# Patient Record
Sex: Male | Born: 1950 | Race: White | Hispanic: No | Marital: Single | Smoking: Never smoker
Health system: Southern US, Community
[De-identification: ages and names within clinical notes are randomized; demographics above are authoritative.]

## PROBLEM LIST (undated history)

## (undated) DIAGNOSIS — M1990 Unspecified osteoarthritis, unspecified site: Secondary | ICD-10-CM

## (undated) HISTORY — PX: TOE SURGERY: SHX1073

## (undated) HISTORY — PX: NECK SURGERY: SHX720

---

## 2011-09-06 ENCOUNTER — Emergency Department (HOSPITAL_COMMUNITY)
Admission: EM | Admit: 2011-09-06 | Discharge: 2011-09-06 | Disposition: A | Discharge status: Home / Self Care | Payer: Self-pay | Attending: Emergency Medicine | Admitting: Emergency Medicine

## 2011-09-06 ENCOUNTER — Emergency Department (HOSPITAL_COMMUNITY): Payer: Self-pay

## 2011-09-06 ENCOUNTER — Encounter (HOSPITAL_COMMUNITY): Payer: Self-pay

## 2011-09-06 DIAGNOSIS — M25569 Pain in unspecified knee: Secondary | ICD-10-CM | POA: Insufficient documentation

## 2011-09-06 DIAGNOSIS — M25469 Effusion, unspecified knee: Secondary | ICD-10-CM | POA: Insufficient documentation

## 2011-09-06 DIAGNOSIS — IMO0002 Reserved for concepts with insufficient information to code with codable children: Secondary | ICD-10-CM | POA: Insufficient documentation

## 2011-09-06 DIAGNOSIS — M1711 Unilateral primary osteoarthritis, right knee: Secondary | ICD-10-CM

## 2011-09-06 DIAGNOSIS — M1710 Unilateral primary osteoarthritis, unspecified knee: Secondary | ICD-10-CM | POA: Insufficient documentation

## 2011-09-06 MED ORDER — OXYCODONE-ACETAMINOPHEN 5-325 MG PO TABS: 1.0000 | ORAL_TABLET | ORAL | Status: AC | PRN

## 2011-09-06 MED ORDER — OXYCODONE-ACETAMINOPHEN 5-325 MG PO TABS
1.0000 | ORAL_TABLET | Freq: Once | ORAL | Status: AC
  Administered 2011-09-06: 1 via ORAL
  Filled 2011-09-06: qty 1

## 2011-09-06 MED ORDER — IBUPROFEN 600 MG PO TABS: 600.0000 mg | ORAL_TABLET | Freq: Four times a day (QID) | ORAL | Status: AC | PRN

## 2011-09-06 NOTE — ED Notes (Signed)
Right knee pain for 1 month, thinks he may have twisted the joint
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
## 2011-09-06 NOTE — ED Notes (Signed)
Pt presents with swelling, redness, and pain to right knee x 1 month. Pt denies injury.

## 2011-09-06 NOTE — ED Notes (Signed)
Pt a/ox4. Resp even and unlabored. NAD at this time. D/C instructions reviewed with pt. Pt verbalized understanding. Pt ambulated to lobby with steady gate.  

## 2011-09-07 NOTE — ED Provider Notes (Signed)
Medical screening examination/treatment/procedure(s) were performed by non-physician practitioner and as supervising physician I was immediately available for consultation/collaboration.   Jelene Albano L Arkie Tagliaferro, MD 09/07/11 0929 

## 2019-10-03 ENCOUNTER — Other Ambulatory Visit: Payer: Self-pay

## 2019-10-03 ENCOUNTER — Emergency Department (HOSPITAL_COMMUNITY): Payer: 59

## 2019-10-03 ENCOUNTER — Emergency Department (HOSPITAL_COMMUNITY)
Admission: EM | Admit: 2019-10-03 | Discharge: 2019-10-03 | Disposition: A | Discharge status: Home / Self Care | Payer: 59 | Attending: Emergency Medicine | Admitting: Emergency Medicine

## 2019-10-03 ENCOUNTER — Encounter (HOSPITAL_COMMUNITY): Payer: Self-pay | Admitting: Emergency Medicine

## 2019-10-03 DIAGNOSIS — M5136 Other intervertebral disc degeneration, lumbar region: Secondary | ICD-10-CM | POA: Diagnosis not present

## 2019-10-03 DIAGNOSIS — Z79899 Other long term (current) drug therapy: Secondary | ICD-10-CM | POA: Diagnosis not present

## 2019-10-03 DIAGNOSIS — M79605 Pain in left leg: Secondary | ICD-10-CM | POA: Insufficient documentation

## 2019-10-03 HISTORY — DX: Unspecified osteoarthritis, unspecified site: M19.90

## 2019-10-03 MED ORDER — PREDNISONE 10 MG (21) PO TBPK: ORAL_TABLET | ORAL | 0 refills | Status: AC

## 2019-10-03 NOTE — ED Provider Notes (Signed)
[facility] EMERGENCY DEPARTMENT Provider Note   CSN: [id number] Arrival date & time: 10/03/19  7564     History Chief Complaint  Patient presents with  . Leg Pain    Lawrence Kaiser is a 69 y.o. male.  HPI   Patient presents to the ED for evaluation of intermittent thigh pain for the last couple of months.  Patient states it has been coming and going but seems becoming more frequent.  Pain is sharp in his left thigh.  It seems to be extending more distally now than when it did initially.  It also wraps around to the outside of his thigh now.  Patient notices that doing certain activities in his yard will bring on the pain.  However he also notices at times just sitting at the table and he will need to stand up because it is painful sitting in the same position.  He denies any fevers or chills.  No leg swelling.  Patient went to an urgent care.  He had x-rays of his leg that were negative reportedly.  He was prescribed meloxicam.  Patient states he continues to have this pain so he came to the ED today.  Past Medical History:  Diagnosis Date  . Arthritis     There are no problems to display for this patient.   Past Surgical History:  Procedure Laterality Date  . NECK SURGERY         History reviewed. No pertinent family history.  Social History   Tobacco Use  . Smoking status: Never Smoker  Substance Use Topics  . Alcohol use: No  . Drug use: No    Home Medications Prior to Admission medications   Medication Sig Start Date End Date Taking? Authorizing Provider  Multiple Vitamin (MULTIVITAMIN WITH MINERALS) TABS tablet Take 1 tablet by mouth daily.   Yes [provider]  clotrimazole-betamethasone (LOTRISONE) cream Apply 1 application topically in the morning and at bedtime. 08/29/19   [provider]  meloxicam (MOBIC) 15 MG tablet Take 15 mg by mouth daily. 08/30/19   [provider]  neomycin-polymyxin b-dexamethasone (MAXITROL) 3.5-10000-0.1  SUSP Place 1 drop into both eyes 4 (four) times daily. 07/26/19   [provider]  Potassium Gluconate 2.5 MEQ TABS Take 1 tablet by mouth daily.    [provider]  predniSONE (STERAPRED UNI-PAK 21 TAB) 10 MG (21) TBPK tablet Take 6 tablets (60 mg total) by mouth daily for 2 days, THEN 5 tablets (50 mg total) daily for 2 days, THEN 4 tablets (40 mg total) daily for 2 days, THEN 3 tablets (30 mg total) daily for 2 days, THEN 2 tablets (20 mg total) daily for 2 days, THEN 1 tablet (10 mg total) daily for 2 days. 10/03/19 10/15/19  Dorie Rank, MD  tobramycin (TOBREX) 0.3 % ophthalmic solution Place 1 drop into both eyes 3 (three) times daily. 05/24/19   [provider]    Allergies    Patient has no known allergies.  Review of Systems   Review of Systems  All other systems reviewed and are negative.   Physical Exam Updated Vital Signs BP (!) 153/70 (BP Location: Right Arm)   Pulse 63   Temp 97.8 F (36.6 C) (Oral)   Resp 16   Ht 1.829 m (6')   Wt 130.2 kg   SpO2 97%   BMI 38.92 kg/m   Physical Exam Vitals and nursing note reviewed.  Constitutional:      General: He is  not in acute distress.    Appearance: He is well-developed.  HENT:     Head: Normocephalic and atraumatic.     Right Ear: External ear normal.     Left Ear: External ear normal.  Eyes:     General: No scleral icterus.       Right eye: No discharge.        Left eye: No discharge.     Conjunctiva/sclera: Conjunctivae normal.  Neck:     Trachea: No tracheal deviation.  Cardiovascular:     Rate and Rhythm: Normal rate.  Pulmonary:     Effort: Pulmonary effort is normal. No respiratory distress.     Breath sounds: No stridor.  Abdominal:     General: There is no distension.     Tenderness: There is no abdominal tenderness.  Musculoskeletal:        General: No swelling or deformity.     Cervical back: Neck supple.     Lumbar back: No tenderness.     Right lower leg: No swelling or  deformity. No edema.     Left lower leg: No swelling or deformity. No edema.     Comments: Patient is able to sit up in bed, no discrete areas of tenderness in the extremity, strong dorsalis pedal pulses bilaterally  Skin:    General: Skin is warm and dry.     Findings: No rash.  Neurological:     Mental Status: He is alert.     Cranial Nerves: Cranial nerve deficit: no gross deficits.     ED Results / Procedures / Treatments   Labs (all labs ordered are listed, but only abnormal results are displayed) Labs Reviewed - No data to display  EKG None  Radiology DG Lumbar Spine Complete  Result Date: 10/03/2019 CLINICAL DATA:  Left-sided radicular type symptoms EXAM: LUMBAR SPINE - COMPLETE 4+ VIEW COMPARISON:  None. FINDINGS: Frontal, lateral, spot lumbosacral lateral, and bilateral oblique views were obtained. There are 5 non-rib-bearing lumbar type vertebral bodies. There is no fracture or spondylolisthesis. There is severe disc space narrowing at L4-5 and L5-S1. There is milder disc space narrowing at L3-4. There are prominent anterior osteophytes at L3, L4, and L5. There is facet osteoarthritic change at L3-4, L4-5, and L5-S1 bilaterally. There are occasional foci of aortic atherosclerosis. IMPRESSION: Osteoarthritic change in the lower lumbar region. No fracture or spondylolisthesis. Aortic Atherosclerosis (ICD10-I70.0). Electronically Signed   By: Bretta Bang III M.D.   On: 10/03/2019 11:01    Procedures Procedures (including critical care time)  Medications Ordered in ED Medications - No data to display  ED Course  I have reviewed the triage vital signs and the nursing notes.  Pertinent labs & imaging results that were available during my care of the patient were reviewed by me and considered in my medical decision making (see chart for details).  Clinical Course as of Oct 02 1129  Mon Oct 03, 2019  1108 Xrays reviewed.  Osteoarthritis noted.    [JK]    Clinical  Course User Index [JK] Linwood Dibbles, MD   MDM Rules/Calculators/A&P                      Physical exam is reassuring.  No signs of infection.  No signs of vascular compromise.  Symptoms are not suggestive of vascular occlusion.  Suspect he could be having some lumbar radiculopathy.  Patient does have some degenerative changes noted on plain films.  He does  not have any acute numbness or weakness.  He has been taking NSAIDs.  I think a short course of steroids is reasonable but I discussed with the patient this may or may not alleviate his symptoms.  Did recommend outpatient follow-up with his primary care doctor and/or a spine surgeon Final Clinical Impression(s) / ED Diagnoses Final diagnoses:  Left leg pain  DDD (degenerative disc disease), lumbar    Rx / DC Orders ED Discharge Orders         Ordered    predniSONE (STERAPRED UNI-PAK 21 TAB) 10 MG (21) TBPK tablet     10/03/19 1130           Linwood Dibbles, MD 10/03/19 1132

## 2019-10-03 NOTE — Discharge Instructions (Signed)
Take the medications as prescribed.  You can continue the meloxicam.  Follow-up with your primary care doctor and consider seeing a spine surgeon as we discussed

## 2019-10-03 NOTE — ED Triage Notes (Signed)
Pt reports having pain in left thigh intermittently for several months. Reports it is worse with certain movements and is excruciating at times.

## 2021-05-29 IMAGING — DX DG LUMBAR SPINE COMPLETE 4+V
5 series · 5 of 5 positions shown · non-contrast
Comparison: None.

CLINICAL DATA: Left-sided radicular type symptoms

EXAM:
LUMBAR SPINE - COMPLETE 4+ VIEW

[l-spine ap]
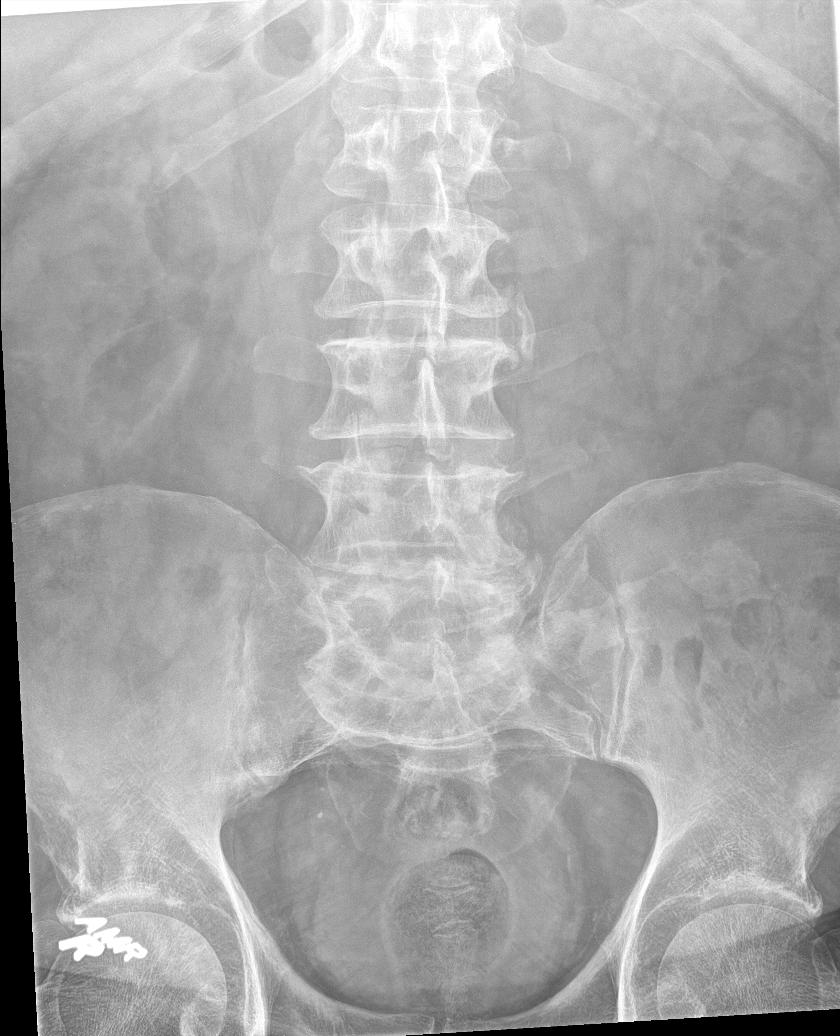

[l-spine obl (1 of 2)]
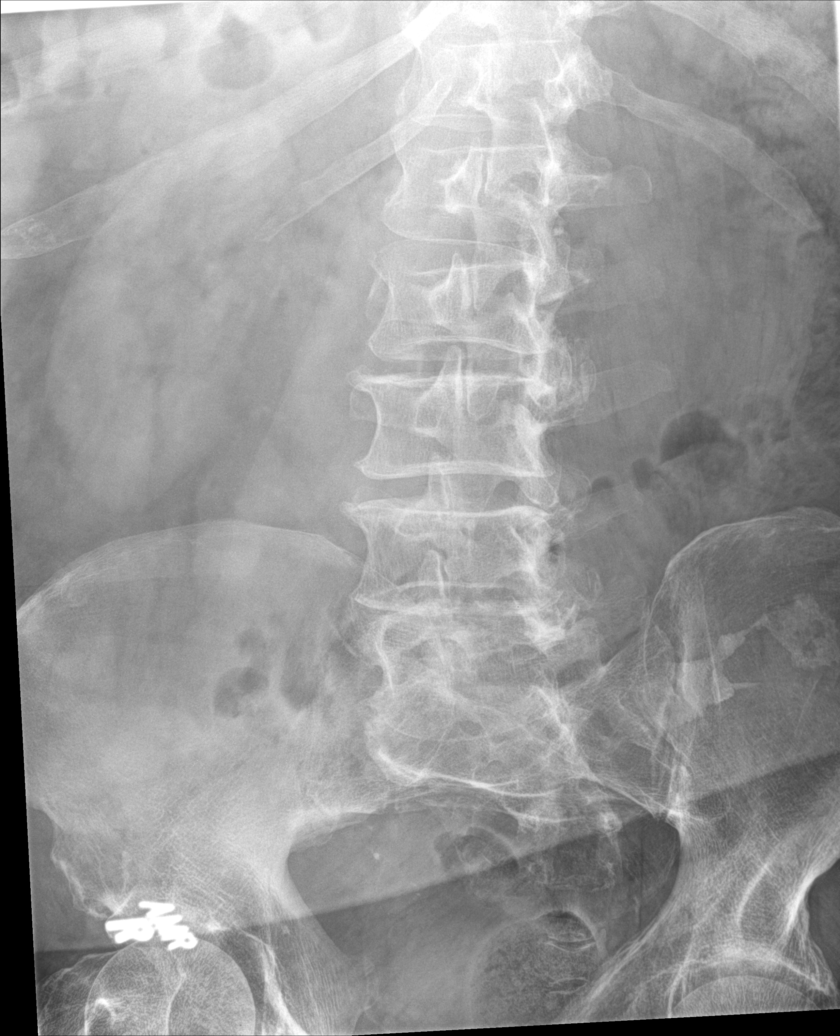

[l-spine obl (2 of 2)]
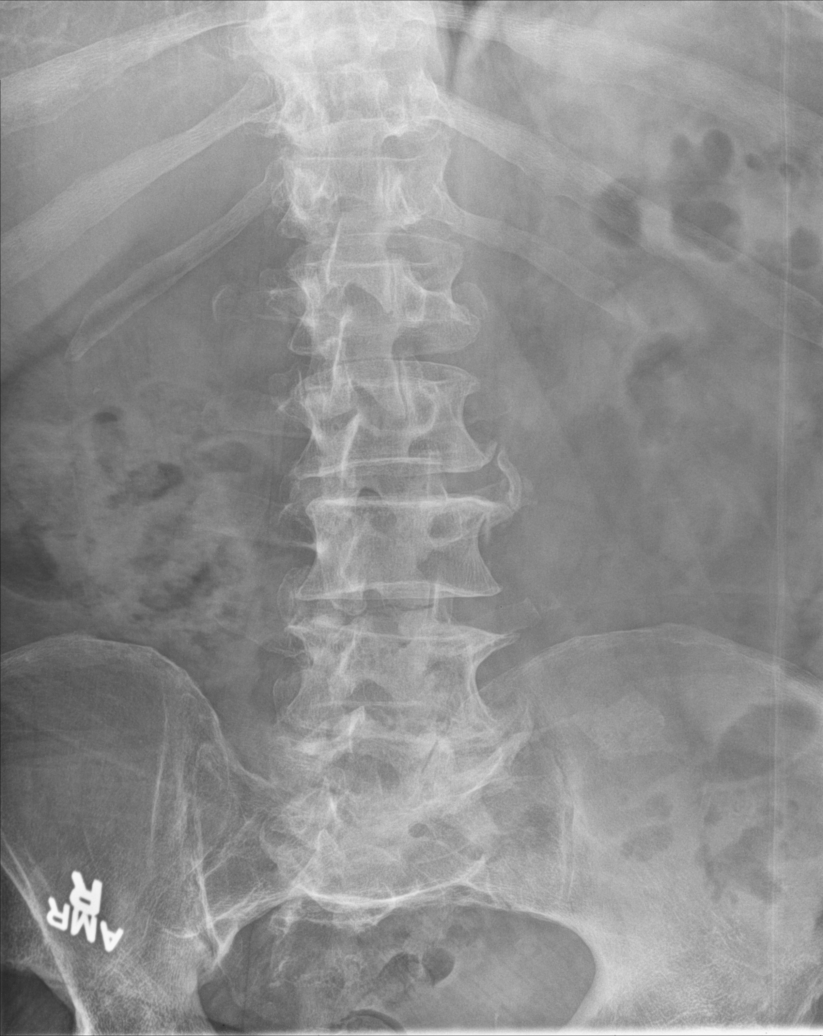

[l-spine lat]
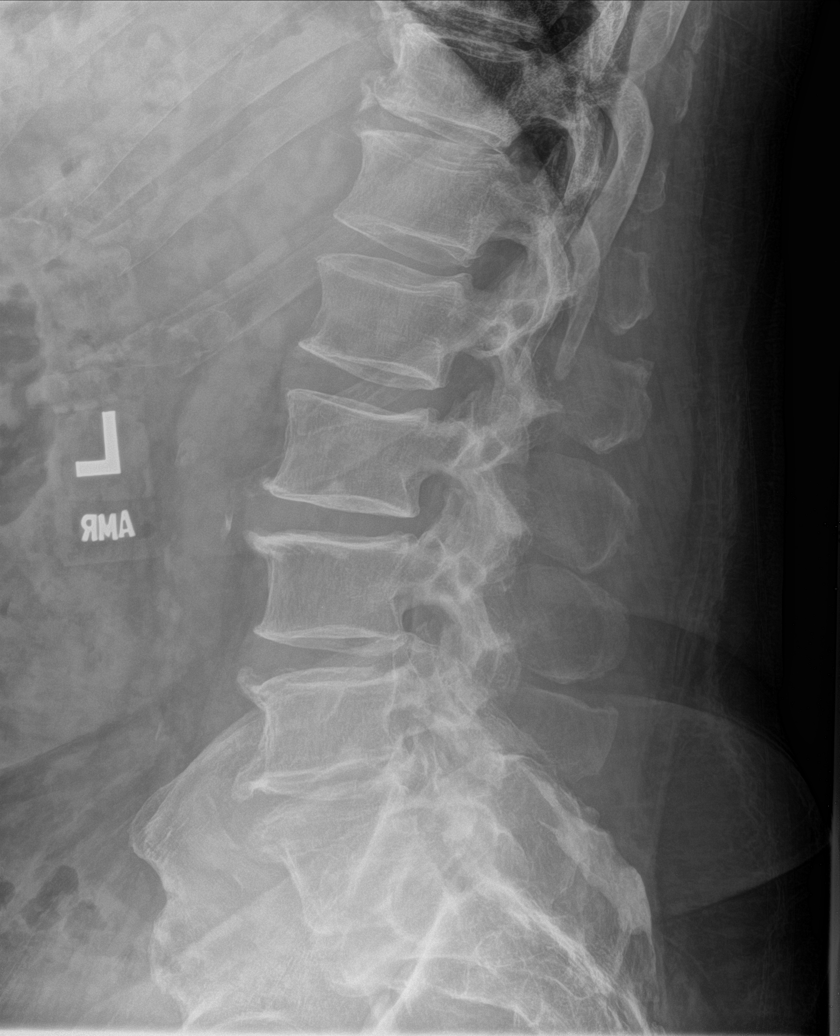

[l-spine spot]
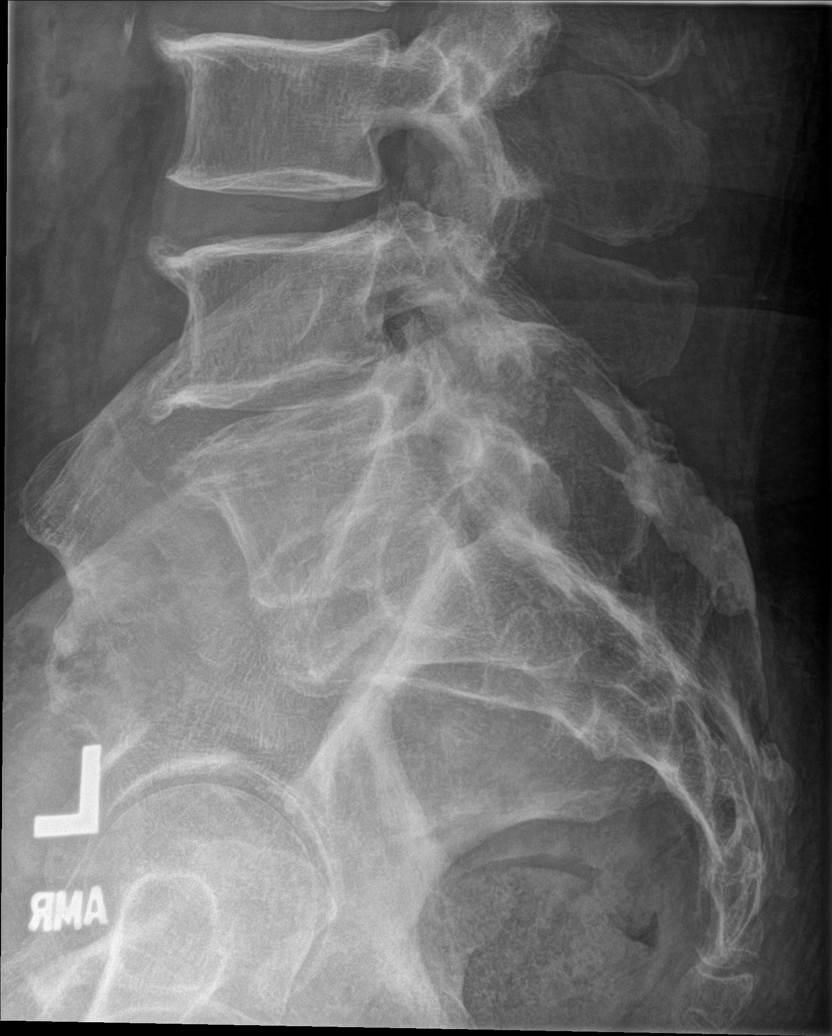

[5 of 5 positions shown; findings below may reference images not displayed]

FINDINGS: Frontal, lateral, spot lumbosacral lateral, and bilateral oblique
views were obtained. There are 5 non-rib-bearing lumbar type
vertebral bodies. There is no fracture or spondylolisthesis. There
is severe disc space narrowing at L4-5 and L5-S1. There is milder
disc space narrowing at L3-4. There are prominent anterior
osteophytes at L3, L4, and L5. There is facet osteoarthritic change
at L3-4, L4-5, and L5-S1 bilaterally. There are occasional foci of
aortic atherosclerosis.
IMPRESSION: Osteoarthritic change in the lower lumbar region. No fracture or
spondylolisthesis.

Aortic Atherosclerosis ([id number]).

## 2024-01-06 ENCOUNTER — Encounter (HOSPITAL_COMMUNITY): Payer: Self-pay | Admitting: Emergency Medicine

## 2024-01-06 ENCOUNTER — Other Ambulatory Visit: Payer: Self-pay

## 2024-01-06 ENCOUNTER — Emergency Department (HOSPITAL_COMMUNITY)
Admission: EM | Admit: 2024-01-06 | Discharge: 2024-01-06 | Disposition: A | Discharge status: Home / Self Care | Attending: Emergency Medicine | Admitting: Emergency Medicine

## 2024-01-06 DIAGNOSIS — N481 Balanitis: Secondary | ICD-10-CM | POA: Diagnosis not present

## 2024-01-06 MED ORDER — CLOTRIMAZOLE-BETAMETHASONE 1-0.05 % EX CREA: 1.0000 | TOPICAL_CREAM | Freq: Two times a day (BID) | CUTANEOUS | 1 refills | Status: DC

## 2024-01-06 NOTE — ED Provider Notes (Signed)
 [facility] EMERGENCY DEPARTMENT AT [facility] Provider Note   CSN: [id number] Arrival date & time: 01/06/24  8657     History  Chief Complaint  Patient presents with   Rash    Lawrence Kaiser is a 73 y.o. male.  Patient is a 73 year old male who presents to the emergency department the chief complaint of rash around the head of his penis as well as swelling of his foreskin.  He notes that this has been an ongoing issue for approximate the past 3 years.  Patient notes that he is concerned that he may need to be circumcised.  He notes that he has had no changes in urination to include dysuria or hematuria and he has been urinating at his baseline.  He notes that he has been using Lotrin gel.  Patient denies any abdominal pain.  He has had no pain or swelling to his testicles.   Rash      Home Medications Prior to Admission medications   Medication Sig Start Date End Date Taking? Authorizing Provider  clotrimazole-betamethasone (LOTRISONE) cream Apply 1 application topically in the morning and at bedtime. 08/29/19   [provider]  meloxicam (MOBIC) 15 MG tablet Take 15 mg by mouth daily. 08/30/19   [provider]  Multiple Vitamin (MULTIVITAMIN WITH MINERALS) TABS tablet Take 1 tablet by mouth daily.    [provider]  neomycin-polymyxin b-dexamethasone (MAXITROL) 3.5-10000-0.1 SUSP Place 1 drop into both eyes 4 (four) times daily. 07/26/19   [provider]  Potassium Gluconate 2.5 MEQ TABS Take 1 tablet by mouth daily.    [provider]  tobramycin (TOBREX) 0.3 % ophthalmic solution Place 1 drop into both eyes 3 (three) times daily. 05/24/19   [provider]      Allergies    Patient has no known allergies.    Review of Systems   Review of Systems  Skin:  Positive for rash.    Physical Exam Updated Vital Signs BP 119/70 (BP Location: Right Arm)   Pulse (!) 57   Temp 98 F (36.7 C) (Oral)   Resp 18   Ht  6' 2 (1.88 m)   Wt 121.1 kg   SpO2 98%   BMI 34.28 kg/m  Physical Exam Vitals and nursing note reviewed.  Constitutional:      Appearance: Normal appearance.  HENT:     Head: Normocephalic and atraumatic.  Eyes:     Extraocular Movements: Extraocular movements intact.     Conjunctiva/sclera: Conjunctivae normal.     Pupils: Pupils are equal, round, and reactive to light.  Cardiovascular:     Rate and Rhythm: Normal rate and regular rhythm.     Pulses: Normal pulses.  Pulmonary:     Effort: Pulmonary effort is normal. No respiratory distress.  Genitourinary:    Comments: Erythema noted to head of penis, no purulent discharge, mild swelling noted to the foreskin, unable to retract foreskin Skin:    General: Skin is warm and dry.  Neurological:     General: No focal deficit present.     Mental Status: He is alert and oriented to person, place, and time. Mental status is at baseline.  Psychiatric:        Mood and Affect: Mood normal.        Behavior: Behavior normal.        Thought Content: Thought content normal.        Judgment: Judgment normal.     ED Results /  Procedures / Treatments   Labs (all labs ordered are listed, but only abnormal results are displayed) Labs Reviewed - No data to display  EKG None  Radiology No results found.  Procedures Procedures    Medications Ordered in ED Medications - No data to display  ED Course/ Medical Decision Making/ A&P                                 Medical Decision Making Patient is doing well at this time and is stable for discharge home.  Discussed with patient we will continue treatment for his balanitis at this point.  Will recommend close follow-up with urology for continued management and care.  Patient is still urinated in his baseline.  He does not warrant any emergent procedures at this point.  The foreskin is easily manipulated with no associated pain.  He has no indication for cellulitis or abscess  formation.  Strict turn precautions were provided for any new or worsening symptoms.  Patient voiced understanding and had no additional questions.           Final Clinical Impression(s) / ED Diagnoses Final diagnoses:  None    Rx / DC Orders ED Discharge Orders     None         Emmalene Hare 01/06/24 0933    Arvilla Birmingham, MD 01/06/24 [phone]

## 2024-01-06 NOTE — Discharge Instructions (Addendum)
 Please follow-up closely with urology on an outpatient basis.  Return to emergency department immediately for any new or worsening symptoms.

## 2024-01-06 NOTE — ED Triage Notes (Signed)
 Pt reports rash to the tip of his penis where foreskin is located x a few mos, pt reports he has been seen for same in the past and prescribed cream, concerned he needs to be circumcised

## 2024-02-17 ENCOUNTER — Ambulatory Visit (INDEPENDENT_AMBULATORY_CARE_PROVIDER_SITE_OTHER): Admitting: Urology

## 2024-02-17 ENCOUNTER — Encounter: Payer: Self-pay | Admitting: Urology

## 2024-02-17 VITALS — BP 150/85 | HR 56

## 2024-02-17 DIAGNOSIS — N481 Balanitis: Secondary | ICD-10-CM | POA: Diagnosis not present

## 2024-02-17 DIAGNOSIS — Z412 Encounter for routine and ritual male circumcision: Secondary | ICD-10-CM

## 2024-02-17 NOTE — Patient Instructions (Signed)
 Balanitis  Balanitis is swelling and irritation of the head of the penis (glans penis). Balanitis occurs most often among males who have not had their foreskin removed (uncircumcised). In uncircumcised males, the condition may also cause inflammation of the skin around the foreskin. Balanitis sometimes causes scarring of the penis or foreskin, which can require surgery. This condition may develop because of an infection or another medical condition. Untreated balanitis can increase the risk of penile cancer. What are the causes? Common causes of this condition include: Irritation and lack of airflow due to fluid (smegma) that can build up on the glans penis. Poor personal hygiene, especially in uncircumcised males. Not cleaning the glans penis and foreskin well can result in a buildup of bacteria, viruses, and yeast, which can lead to infection and inflammation. Other causes include: Chemical irritation from products such as soaps or shower gels, especially those that have fragrance. Chemical irritation can also be caused by condoms, personal lubricants, petroleum jelly, spermicides, fabric softeners, or laundry detergents. Skin conditions, such as eczema, dermatitis, and psoriasis. Allergies to medicines, such as tetracycline and sulfa drugs. What increases the risk? The following factors may make you more likely to develop this condition: Being an uncircumcised male. Having diabetes. Having other medical conditions, including liver cirrhosis, congestive heart failure, or kidney disease. Having infections, such as candidiasis, HPV (human papillomavirus), herpes simplex, gonorrhea, or syphilis. Having a tight foreskin that is difficult to pull back (retract) past the glans penis. Being severely obese. History of reactive arthritis. What are the signs or symptoms? Symptoms of this condition include: Discharge from under the foreskin, and pain or difficulty retracting the foreskin. A bad smell  or itchiness on the penis. Tenderness, redness, and swelling of the glans penis. A rash or sores on the glans penis or foreskin. Inability to get an erection due to pain. Trouble urinating. Scarring of the penis or foreskin, in some cases. How is this diagnosed? This condition may be diagnosed based on a physical exam and tests of a swab of discharge to check for bacterial or fungal infection. You may also have blood tests to check for: Viruses that can cause balanitis. A high blood sugar (glucose) level. This could be a sign of diabetes, which can increase the risk of balanitis. How is this treated? Treatment for this condition depends on the cause. Treatment may include: Improving personal hygiene. Your health care provider may recommend sitting in a bath of warm water that is deep enough to cover your hips and buttocks (sitz bath). Medicines such as: Creams or ointments to reduce swelling (steroids) or to treat an infection. Antibiotic medicine. Antifungal medicine. Having surgery to remove or cut the foreskin (circumcision). This may be done if you have scarring on the foreskin that makes it difficult to retract. Controlling other medical problems that may be causing your condition or making it worse. Follow these instructions at home: Medicines Take over-the-counter and prescription medicines only as told by your health care provider. If you were prescribed an antibiotic medicine, use it as told by your health care provider. Do not stop using the antibiotic even if you start to feel better. General instructions Do not have sex until the condition clears up, or until your health care provider approves. Keep your penis clean and dry. Take sitz baths as recommended by your health care provider. Avoid products that irritate your skin or make symptoms worse, such as soaps and shower gels that have fragrance. Keep all follow-up visits. This is  important. Contact a health care provider  if: Your symptoms get worse or do not improve with home care. You develop chills or a fever. You have trouble urinating. You cannot retract your foreskin. Get help right away if: You develop severe pain. You are unable to urinate. Summary Balanitis is swelling and irritation of the head of the penis (glans penis). This condition is most common among uncircumcised males. Balanitis causes pain, redness, and swelling of the glans penis. Good personal hygiene is important. Treatment may include improving personal hygiene and applying creams or ointments. Contact a health care provider if your symptoms get worse or do not improve with home care. This information is not intended to replace advice given to you by your health care provider. Make sure you discuss any questions you have with your health care provider. Document Revised: 12/26/2020 Document Reviewed: 12/26/2020 Elsevier Patient Education  2024 [employer].

## 2024-02-17 NOTE — Progress Notes (Signed)
 02/17/2024 9:59 AM   Lawrence Kaiser 10/27/1943 [id number]  Referring provider: Harvey Lynwood DASEN, MD [address] [place],  [place]  balanitis   HPI: [name] is a 73yo here for evaluation of balanitis. For the past 12-18 months he has been unable to retract his foreskin and he has had a rash for which he was using clotrimazole  with mixed results. He previously saw a urologist in [place] 3 years ago who recommended circumcision. No difficulty urinating.    PMH: Past Medical History:  Diagnosis Date   Arthritis     Surgical History: Past Surgical History:  Procedure Laterality Date   NECK SURGERY      Home Medications:  Allergies as of 02/17/2024   No Known Allergies      Medication List        Accurate as of February 17, 2024  9:59 AM. If you have any questions, ask your nurse or doctor.          clotrimazole -betamethasone  cream Commonly known as: LOTRISONE  Apply 1 Application topically in the morning and at bedtime.   meloxicam 15 MG tablet Commonly known as: MOBIC Take 15 mg by mouth daily.   multivitamin with minerals Tabs tablet Take 1 tablet by mouth daily.   neomycin-polymyxin b-dexamethasone 3.5-10000-0.1 Susp Commonly known as: MAXITROL Place 1 drop into both eyes 4 (four) times daily.   Potassium Gluconate 2.5 MEQ Tabs Take 1 tablet by mouth daily.   tobramycin 0.3 % ophthalmic solution Commonly known as: TOBREX Place 1 drop into both eyes 3 (three) times daily.        Allergies: No Known Allergies  Family History: No family history on file.  Social History:  reports that he has never smoked. He does not have any smokeless tobacco history on file. He reports that he does not drink alcohol and does not use drugs.  ROS: All other review of systems were reviewed and are negative except what is noted above in HPI  Physical Exam: BP (!) 150/85   Pulse (!) 56   Constitutional:  Alert and oriented, No acute distress. HEENT: [place] AT, moist  mucus membranes.  Trachea midline, no masses. Cardiovascular: No clubbing, cyanosis, or edema. Respiratory: Normal respiratory effort, no increased work of breathing. GI: Abdomen is soft, nontender, nondistended, no abdominal masses GU: No CVA tenderness. Uncircumcised phallus. Severe phimosis and balanitis present. No masses/lesions on penis, testis, scrotum.  Lymph: No cervical or inguinal lymphadenopathy. Skin: No rashes, bruises or suspicious lesions. Neurologic: Grossly intact, no focal deficits, moving all 4 extremities. Psychiatric: Normal mood and affect.  Laboratory Data: No results found for: WBC, HGB, HCT, MCV, PLT  No results found for: CREATININE  No results found for: PSA  No results found for: TESTOSTERONE  No results found for: HGBA1C  Urinalysis No results found for: COLORURINE, APPEARANCEUR, LABSPEC, PHURINE, GLUCOSEU, HGBUR, BILIRUBINUR, KETONESUR, PROTEINUR, UROBILINOGEN, NITRITE, LEUKOCYTESUR  No results found for: LABMICR, WBCUA, RBCUA, LABEPIT, MUCUS, BACTERIA  Pertinent Imaging:  No results found for this or any previous visit.  No results found for this or any previous visit.  No results found for this or any previous visit.  No results found for this or any previous visit.  No results found for this or any previous visit.  No results found for this or any previous visit.  No results found for this or any previous visit.  No results found for this or any previous visit.   Assessment & Plan:  1. Encounter for circumcision (Primary) The risks/benefits/alternatives to circumcision was explained to the patient and he understands and wishes to proceed with surgery - Urinalysis, Routine w reflex microscopic   No follow-ups on file.  Lawrence Clara, MD  [facility] [place]

## 2024-04-04 NOTE — Patient Instructions (Signed)
 Lawrence Kaiser  04/04/2024     @PREFPERIOPPHARMACY @   Your procedure is scheduled on 04/07/2024.   Report to [name] at  [phone]  A.M.   Call this number if you have problems the morning of surgery:  [phone]  If you experience any cold or flu symptoms such as cough, fever, chills, shortness of breath, etc. between now and your scheduled surgery, please notify us  at the above number.   Remember:  Do not eat after midnight.   You may drink clear liquids until 0430 am on 04/07/2024.    Clear liquids allowed are:                    Water, Juice (No red color; non-citric and without pulp; diabetics please choose diet or no sugar options), Carbonated beverages (diabetics please choose diet or no sugar options), Clear Tea (No creamer, milk, or cream, including half & half and powdered creamer), Black Coffee Only (No creamer, milk or cream, including half & half and powdered creamer), and Clear Sports drink (No red color; diabetics please choose diet or no sugar options)    Take these medicines the morning of surgery with A SIP OF WATER                                                      None.    Do not wear jewelry, make-up or nail polish, including gel polish,  artificial nails, or any other type of covering on natural nails (fingers and  toes).  Do not wear lotions, powders, or perfumes, or deodorant.  Do not shave 48 hours prior to surgery.  Men may shave face and neck.  Do not bring valuables to the hospital.  [facility] is not responsible for any belongings or valuables.  Contacts, dentures or bridgework may not be worn into surgery.  Leave your suitcase in the car.  After surgery it may be brought to your room.  For patients admitted to the hospital, discharge time will be determined by your treatment team.  Patients discharged the day of surgery will not be allowed to drive home and must have someone with them for 24 hours.    Special instructions:   DO NOT smoke  tobacco or vape for 24 hours before your procedure.  Please read over the following fact sheets that you were given. Coughing and Deep Breathing, Surgical Site Infection Prevention, Anesthesia Post-op Instructions, and Care and Recovery After Surgery       Circumcision, Adult, Care After After a circumcision, it is common for the area around your cut from surgery (incision) to have: Redness. Swelling. Soreness. Follow these instructions at home: Your doctor may give you more instructions. If you have problems, contact your doctor. Medicines Take or use over-the-counter and prescription medicines only as told by your doctor. If you were prescribed an antibiotic medicine, use it as told by your doctor. Do not stop using it even if you start to feel better. Bathing Do not get your incision area wet for 24 hours after the procedure, or as long as told by your doctor. Do not take baths, swim, or use a hot tub. Ask your doctor about taking showers or sponge baths. When you can shower, do not rub the incision area. Gently pat it  dry. Incision care  Follow instructions from your doctor about how to take care of your incision. Make sure you: Wash your hands with soap and water for at least 20 seconds before and after you change your bandage (dressing). If you cannot use soap and water, use hand sanitizer. Change your dressing. Leave the stitches alone. They will disappear over time. Check your incision area every day for signs of infection. Check for: More redness, swelling, or pain. More fluid or blood. Warmth. Pus or a bad smell. Managing pain and swelling If told, put ice on the painful area. To do this: Put ice in a plastic bag. Place a towel between your skin and the bag. Leave the ice on for 20 minutes, 2-3 times a day. Take off the ice if your skin turns bright red. This is very important. If you cannot feel pain, heat, or cold, you have a greater risk of damage to the  area.  Activity  If you were given a sedative during your procedure, do not drive or use machines until your doctor says that it is safe. A sedative is a medicine that helps you relax. You may have to avoid lifting. Ask your doctor how much you can safely lift. Do not have sex until your doctor says it is okay. Rest as told by your doctor. Return to your normal activities when your doctor says that it is safe. Avoid contact sports and biking until your doctor says it is okay. General instructions Do not smoke or use any products that contain nicotine or tobacco. These can make it take longer for your incision to heal. If you need help quitting, ask your doctor. Drink enough fluid to keep your pee (urine) pale yellow. Keep all follow-up visits. Contact a doctor if: You have a fever. Medicine does not help your pain. You have any of these signs of infection around your incision: More redness, swelling, or pain. More fluid or blood. Warmth. Pus or a bad smell. Your incision breaks open. Get help right away if: You cannot pee, or it hurts to pee. There is redness, swelling, and soreness that spreads up the shaft of your penis, your thighs, or your lower belly (abdomen). You have bleeding that does not stop when you press on it. Summary After the procedure, it is common to have redness, swelling, and soreness around your cut from surgery (incision). Follow instructions from your doctor about how to take care of your incision. Check your incision area every day for signs of infection. Do not have sex until your doctor says it is okay. This information is not intended to replace advice given to you by your health care provider. Make sure you discuss any questions you have with your health care provider. Document Revised: 07/15/2021 Document Reviewed: 07/15/2021 Elsevier Patient Education  2024 [employer]General Anesthesia, Adult, Care After The following information offers guidance on  how to care for yourself after your procedure. Your health care provider may also give you more specific instructions. If you have problems or questions, contact your health care provider. What can I expect after the procedure? After the procedure, it is common for people to: Have pain or discomfort at the IV site. Have nausea or vomiting. Have a sore throat or hoarseness. Have trouble concentrating. Feel cold or chills. Feel weak, sleepy, or tired (fatigue). Have soreness and body aches. These can affect parts of the body that were not involved in surgery. Follow these instructions at home: For the  time period you were told by your health care provider:  Rest. Do not participate in activities where you could fall or become injured. Do not drive or use machinery. Do not drink alcohol. Do not take sleeping pills or medicines that cause drowsiness. Do not make important decisions or sign legal documents. Do not take care of children on your own. General instructions Drink enough fluid to keep your urine pale yellow. If you have sleep apnea, surgery and certain medicines can increase your risk for breathing problems. Follow instructions from your health care provider about wearing your sleep device: Anytime you are sleeping, including during daytime naps. While taking prescription pain medicines, sleeping medicines, or medicines that make you drowsy. Return to your normal activities as told by your health care provider. Ask your health care provider what activities are safe for you. Take over-the-counter and prescription medicines only as told by your health care provider. Do not use any products that contain nicotine or tobacco. These products include cigarettes, chewing tobacco, and vaping devices, such as e-cigarettes. These can delay incision healing after surgery. If you need help quitting, ask your health care provider. Contact a health care provider if: You have nausea or vomiting  that does not get better with medicine. You vomit every time you eat or drink. You have pain that does not get better with medicine. You cannot urinate or have bloody urine. You develop a skin rash. You have a fever. Get help right away if: You have trouble breathing. You have chest pain. You vomit blood. These symptoms may be an emergency. Get help right away. Call 911. Do not wait to see if the symptoms will go away. Do not drive yourself to the hospital. Summary After the procedure, it is common to have a sore throat, hoarseness, nausea, vomiting, or to feel weak, sleepy, or fatigue. For the time period you were told by your health care provider, do not drive or use machinery. Get help right away if you have difficulty breathing, have chest pain, or vomit blood. These symptoms may be an emergency. This information is not intended to replace advice given to you by your health care provider. Make sure you discuss any questions you have with your health care provider. Document Revised: 10/11/2021 Document Reviewed: 10/11/2021 Elsevier Patient Education  2024 [employer].

## 2024-04-05 ENCOUNTER — Encounter (HOSPITAL_COMMUNITY): Payer: Self-pay

## 2024-04-05 ENCOUNTER — Encounter (HOSPITAL_COMMUNITY)
Admission: RE | Admit: 2024-04-05 | Discharge: 2024-04-05 | Disposition: A | Discharge status: Home / Self Care | Source: Ambulatory Visit | Attending: Urology | Admitting: Urology

## 2024-04-05 VITALS — BP 129/72 | HR 62 | Resp 18 | Ht 74.0 in | Wt 267.0 lb

## 2024-04-05 DIAGNOSIS — Z01812 Encounter for preprocedural laboratory examination: Secondary | ICD-10-CM | POA: Diagnosis present

## 2024-04-05 DIAGNOSIS — Z01818 Encounter for other preprocedural examination: Secondary | ICD-10-CM

## 2024-04-05 LAB — CBC WITH DIFFERENTIAL/PLATELET
Abs Immature Granulocytes: 0.03 K/uL (ref 0.00–0.07)
Basophils Absolute: 0.1 K/uL (ref 0.0–0.1)
Basophils Relative: 1 %
Eosinophils Absolute: 0.1 K/uL (ref 0.0–0.5)
Eosinophils Relative: 1 %
HCT: 47.8 % (ref 39.0–52.0)
Hemoglobin: 15.6 g/dL (ref 13.0–17.0)
Immature Granulocytes: 0 %
Lymphocytes Relative: 25 %
Lymphs Abs: 1.9 K/uL (ref 0.7–4.0)
MCH: 29.7 pg (ref 26.0–34.0)
MCHC: 32.6 g/dL (ref 30.0–36.0)
MCV: 90.9 fL (ref 80.0–100.0)
Monocytes Absolute: 0.6 K/uL (ref 0.1–1.0)
Monocytes Relative: 8 %
Neutro Abs: 5.1 K/uL (ref 1.7–7.7)
Neutrophils Relative %: 65 %
Platelets: 191 K/uL (ref 150–400)
RBC: 5.26 MIL/uL (ref 4.22–5.81)
RDW: 14 % (ref 11.5–15.5)
WBC: 7.9 K/uL (ref 4.0–10.5)
nRBC: 0 % (ref 0.0–0.2)

## 2024-04-05 LAB — BASIC METABOLIC PANEL WITH GFR
Anion gap: 7 (ref 5–15)
BUN: 19 mg/dL (ref 8–23)
CO2: 25 mmol/L (ref 22–32)
Calcium: 9.2 mg/dL (ref 8.9–10.3)
Chloride: 105 mmol/L (ref 98–111)
Creatinine, Ser: 0.9 mg/dL (ref 0.61–1.24)
GFR, Estimated: >60 mL/min (ref >60)
Glucose, Bld: 120 mg/dL — ABNORMAL HIGH (ref 70–99)
Potassium: 4 mmol/L (ref 3.5–5.1)
Sodium: 137 mmol/L (ref 135–145)

## 2024-04-07 ENCOUNTER — Encounter (HOSPITAL_COMMUNITY): Payer: Self-pay | Admitting: Urology

## 2024-04-07 ENCOUNTER — Ambulatory Visit (HOSPITAL_COMMUNITY)
Admission: RE | Admit: 2024-04-07 | Discharge: 2024-04-07 | Disposition: A | Discharge status: Home / Self Care | Attending: Urology | Admitting: Urology

## 2024-04-07 ENCOUNTER — Ambulatory Visit (HOSPITAL_COMMUNITY): Payer: Self-pay | Admitting: Certified Registered Nurse Anesthetist

## 2024-04-07 ENCOUNTER — Encounter (HOSPITAL_COMMUNITY)
Admission: RE | Disposition: A | Discharge status: Home / Self Care | Payer: Self-pay | Source: Home / Self Care | Attending: Urology

## 2024-04-07 DIAGNOSIS — Z412 Encounter for routine and ritual male circumcision: Secondary | ICD-10-CM | POA: Diagnosis present

## 2024-04-07 DIAGNOSIS — Z539 Procedure and treatment not carried out, unspecified reason: Secondary | ICD-10-CM | POA: Insufficient documentation

## 2024-04-07 SURGERY — CIRCUMCISION, ADULT
Anesthesia: General

## 2024-04-07 MED ORDER — FENTANYL CITRATE (PF) 100 MCG/2ML IJ SOLN
INTRAMUSCULAR | Status: AC
  Filled 2024-04-07: qty 2

## 2024-04-07 MED ORDER — SEVOFLURANE IN SOLN
RESPIRATORY_TRACT | Status: AC
  Filled 2024-04-07: qty 750

## 2024-04-07 MED ORDER — SODIUM CHLORIDE 0.9 % IV SOLN
3.0000 g | INTRAVENOUS | Status: DC
  Filled 2024-04-07: qty 3

## 2024-04-07 MED ORDER — PROPOFOL 10 MG/ML IV BOLUS
INTRAVENOUS | Status: AC
  Filled 2024-04-07: qty 20

## 2024-04-07 SURGICAL SUPPLY — 22 items
BNDG COHESIVE 2X5 TAN STRL LF (GAUZE/BANDAGES/DRESSINGS) ×2 IMPLANT
BNDG CONFORM 2 STRL LF (GAUZE/BANDAGES/DRESSINGS) ×2 IMPLANT
COVER LIGHT HANDLE STERIS (MISCELLANEOUS) ×4 IMPLANT
DECANTER SPIKE VIAL GLASS SM (MISCELLANEOUS) ×2 IMPLANT
ELECT NEEDLE BLADE 2-5/6 (NEEDLE) ×2 IMPLANT
ELECTRODE REM PT RTRN 9FT ADLT (ELECTROSURGICAL) ×2 IMPLANT
GAUZE SPONGE 4X4 12PLY STRL (GAUZE/BANDAGES/DRESSINGS) ×2 IMPLANT
GAUZE XEROFORM 1X8 LF (GAUZE/BANDAGES/DRESSINGS) ×2 IMPLANT
GLOVE BIO SURGEON STRL SZ8 (GLOVE) ×2 IMPLANT
GLOVE BIOGEL PI IND STRL 7.0 (GLOVE) ×4 IMPLANT
GLOVE BIOGEL PI IND STRL 8 (GLOVE) ×2 IMPLANT
GOWN STRL REUS W/TWL LRG LVL3 (GOWN DISPOSABLE) ×2 IMPLANT
GOWN STRL REUS W/TWL XL LVL3 (GOWN DISPOSABLE) ×2 IMPLANT
KIT TURNOVER KIT A (KITS) ×2 IMPLANT
NEEDLE HYPO 25X1 1.5 SAFETY (NEEDLE) ×2 IMPLANT
NS IRRIG 1000ML POUR BTL (IV SOLUTION) ×2 IMPLANT
PACK MINOR (CUSTOM PROCEDURE TRAY) ×2 IMPLANT
PAD ARMBOARD POSITIONER FOAM (MISCELLANEOUS) ×2 IMPLANT
POSITIONER HEAD 8X9X4 ADT (SOFTGOODS) ×2 IMPLANT
SET BASIN LINEN APH (SET/KITS/TRAYS/PACK) ×2 IMPLANT
SUT VIC AB 4-0 SH 27XBRD (SUTURE) ×4 IMPLANT
SYR CONTROL 10ML LL (SYRINGE) ×2 IMPLANT

## 2024-04-07 NOTE — Progress Notes (Signed)
 Upon arrival and discussion with patient, he states transportation brought him and is taking him home.  I asked if someone was with him to ride with him home and he states no one is available.  Dr. Herschell and Dr. Sherrilee made aware and they state patient must have someone available to ride with him on transportation.  Patient made aware of policy and he states okay I guess I need to reschedule.  Dr. Sherrilee spoke to patient before he left.  Unhooked patient from the monitor and gave him his clothes to change.  Patient ambulatory to lobby.  Patient did not have an IV started and received no medications.

## 2024-04-07 NOTE — Anesthesia Preprocedure Evaluation (Addendum)
 Anesthesia Evaluation  Patient identified by MRN, date of birth, ID band Patient awake    Reviewed: Allergy & Precautions, H&P , NPO status , Patient's Chart, lab work & pertinent test results  Airway Mallampati: III  TM Distance: >3 FB Neck ROM: Full    Dental  (+) Upper Dentures   Pulmonary neg pulmonary ROS   Pulmonary exam normal breath sounds clear to auscultation       Cardiovascular negative cardio ROS Normal cardiovascular exam Rhythm:Regular Rate:Normal     Neuro/Psych negative neurological ROS  negative psych ROS   GI/Hepatic negative GI ROS, Neg liver ROS,,,  Endo/Other  negative endocrine ROS    Renal/GU negative Renal ROS  negative genitourinary   Musculoskeletal  (+) Arthritis ,    Abdominal   Peds negative pediatric ROS (+)  Hematology negative hematology ROS (+)   Anesthesia Other Findings   Reproductive/Obstetrics negative OB ROS                              Anesthesia Physical Anesthesia Plan  ASA: 2  Anesthesia Plan: General   Post-op Pain Management:    Induction: Intravenous  PONV Risk Score and Plan:   Airway Management Planned: LMA  Additional Equipment:   Intra-op Plan:   Post-operative Plan: Extubation in OR  Informed Consent: I have reviewed the patients History and Physical, chart, labs and discussed the procedure including the risks, benefits and alternatives for the proposed anesthesia with the patient or authorized representative who has indicated his/her understanding and acceptance.     Dental advisory given  Plan Discussed with: CRNA  Anesthesia Plan Comments:          Anesthesia Quick Evaluation

## 2024-04-20 ENCOUNTER — Ambulatory Visit: Admitting: Urology

## 2024-05-10 ENCOUNTER — Encounter (HOSPITAL_COMMUNITY)
Admission: RE | Admit: 2024-05-10 | Discharge: 2024-05-10 | Disposition: A | Discharge status: Home / Self Care | Source: Ambulatory Visit | Attending: Urology | Admitting: Urology

## 2024-05-17 ENCOUNTER — Encounter (HOSPITAL_COMMUNITY): Payer: Self-pay

## 2024-05-17 ENCOUNTER — Encounter (HOSPITAL_COMMUNITY)
Admission: RE | Admit: 2024-05-17 | Discharge: 2024-05-17 | Disposition: A | Discharge status: Home / Self Care | Source: Ambulatory Visit | Attending: Urology | Admitting: Urology

## 2024-05-17 ENCOUNTER — Other Ambulatory Visit: Payer: Self-pay

## 2024-05-17 NOTE — Pre-Procedure Instructions (Signed)
 Attempted pre-op phone call. Left VM for him to call us back.

## 2024-05-19 ENCOUNTER — Ambulatory Visit (HOSPITAL_COMMUNITY): Admitting: Anesthesiology

## 2024-05-19 ENCOUNTER — Ambulatory Visit (HOSPITAL_COMMUNITY)
Admission: RE | Admit: 2024-05-19 | Discharge: 2024-05-19 | Disposition: A | Discharge status: Home / Self Care | Attending: Urology | Admitting: Urology

## 2024-05-19 ENCOUNTER — Encounter (HOSPITAL_COMMUNITY): Payer: Self-pay | Admitting: Urology

## 2024-05-19 ENCOUNTER — Encounter (HOSPITAL_COMMUNITY)
Admission: RE | Disposition: A | Discharge status: Home / Self Care | Payer: Self-pay | Source: Home / Self Care | Attending: Urology

## 2024-05-19 DIAGNOSIS — I10 Essential (primary) hypertension: Secondary | ICD-10-CM | POA: Diagnosis not present

## 2024-05-19 DIAGNOSIS — N471 Phimosis: Secondary | ICD-10-CM | POA: Diagnosis present

## 2024-05-19 DIAGNOSIS — N481 Balanitis: Secondary | ICD-10-CM | POA: Insufficient documentation

## 2024-05-19 HISTORY — PX: CIRCUMCISION: SHX1350

## 2024-05-19 SURGERY — CIRCUMCISION, ADULT
Anesthesia: General | Site: Penis

## 2024-05-19 MED ORDER — ONDANSETRON HCL 4 MG/2ML IJ SOLN
INTRAMUSCULAR | Status: AC
  Filled 2024-05-19: qty 2

## 2024-05-19 MED ORDER — ORAL CARE MOUTH RINSE: 15.0000 mL | Freq: Once | OROMUCOSAL | Status: DC

## 2024-05-19 MED ORDER — MIDAZOLAM HCL (PF) 2 MG/2ML IJ SOLN
INTRAMUSCULAR | Status: DC | PRN
  Administered 2024-05-19: 2 mg via INTRAVENOUS

## 2024-05-19 MED ORDER — CHLORHEXIDINE GLUCONATE 0.12 % MT SOLN
15.0000 mL | Freq: Once | OROMUCOSAL | Status: AC
  Administered 2024-05-19: 15 mL via OROMUCOSAL

## 2024-05-19 MED ORDER — BUPIVACAINE HCL (PF) 0.25 % IJ SOLN
INTRAMUSCULAR | Status: DC | PRN
  Administered 2024-05-19: 10 mL

## 2024-05-19 MED ORDER — CEFAZOLIN SODIUM-DEXTROSE 2-4 GM/100ML-% IV SOLN
2.0000 g | INTRAVENOUS | Status: AC
  Administered 2024-05-19: 2 g via INTRAVENOUS
  Filled 2024-05-19: qty 100

## 2024-05-19 MED ORDER — ORAL CARE MOUTH RINSE: 15.0000 mL | Freq: Once | OROMUCOSAL | Status: AC

## 2024-05-19 MED ORDER — ONDANSETRON HCL 4 MG/2ML IJ SOLN
INTRAMUSCULAR | Status: DC | PRN
  Administered 2024-05-19: 4 mg via INTRAVENOUS

## 2024-05-19 MED ORDER — FENTANYL CITRATE (PF) 250 MCG/5ML IJ SOLN
INTRAMUSCULAR | Status: DC | PRN
  Administered 2024-05-19: 25 ug via INTRAVENOUS
  Administered 2024-05-19: 50 ug via INTRAVENOUS
  Administered 2024-05-19: 25 ug via INTRAVENOUS

## 2024-05-19 MED ORDER — DEXAMETHASONE SOD PHOSPHATE PF 10 MG/ML IJ SOLN
INTRAMUSCULAR | Status: DC | PRN
Start: 2024-05-19 — End: 2024-05-19
  Administered 2024-05-19: 4 mg via INTRAVENOUS

## 2024-05-19 MED ORDER — 0.9 % SODIUM CHLORIDE (POUR BTL) OPTIME
TOPICAL | Status: DC | PRN
  Administered 2024-05-19: 1000 mL

## 2024-05-19 MED ORDER — BUPIVACAINE HCL (PF) 0.25 % IJ SOLN
INTRAMUSCULAR | Status: AC
  Filled 2024-05-19: qty 30

## 2024-05-19 MED ORDER — LACTATED RINGERS IV SOLN: INTRAVENOUS | Status: DC

## 2024-05-19 MED ORDER — LIDOCAINE 2% (20 MG/ML) 5 ML SYRINGE
INTRAMUSCULAR | Status: DC | PRN
  Administered 2024-05-19: 100 mg via INTRAVENOUS

## 2024-05-19 MED ORDER — PROPOFOL 10 MG/ML IV BOLUS
INTRAVENOUS | Status: AC
  Filled 2024-05-19: qty 20

## 2024-05-19 MED ORDER — BACITRACIN ZINC 500 UNIT/GM EX OINT: TOPICAL_OINTMENT | CUTANEOUS | 0 refills | Status: AC

## 2024-05-19 MED ORDER — FENTANYL CITRATE (PF) 100 MCG/2ML IJ SOLN
INTRAMUSCULAR | Status: AC
  Filled 2024-05-19: qty 2

## 2024-05-19 MED ORDER — CHLORHEXIDINE GLUCONATE 0.12 % MT SOLN: 15.0000 mL | Freq: Once | OROMUCOSAL | Status: DC

## 2024-05-19 MED ORDER — MIDAZOLAM HCL 2 MG/2ML IJ SOLN
INTRAMUSCULAR | Status: AC
  Filled 2024-05-19: qty 2

## 2024-05-19 MED ORDER — PROPOFOL 10 MG/ML IV BOLUS
INTRAVENOUS | Status: DC | PRN
Start: 2024-05-19 — End: 2024-05-19
  Administered 2024-05-19: 200 mg via INTRAVENOUS

## 2024-05-19 MED ORDER — HYDROCODONE-ACETAMINOPHEN 5-325 MG PO TABS: 1.0000 | ORAL_TABLET | Freq: Four times a day (QID) | ORAL | 0 refills | Status: AC | PRN

## 2024-05-19 MED ORDER — LIDOCAINE HCL (CARDIAC) PF 100 MG/5ML IV SOSY: PREFILLED_SYRINGE | INTRAVENOUS | Status: DC | PRN

## 2024-05-19 SURGICAL SUPPLY — 24 items
BNDG COHESIVE 2X5 TAN ST LF (GAUZE/BANDAGES/DRESSINGS) ×1 IMPLANT
COVER LIGHT HANDLE STERIS (MISCELLANEOUS) ×2 IMPLANT
ELECT NDL BLADE 2-5/6 (NEEDLE) ×1 IMPLANT
ELECT NEEDLE BLADE 2-5/6 (NEEDLE) ×1 IMPLANT
ELECTRODE REM PT RTRN 9FT ADLT (ELECTROSURGICAL) ×1 IMPLANT
GAUZE SPONGE 4X4 12PLY STRL (GAUZE/BANDAGES/DRESSINGS) ×1 IMPLANT
GAUZE STRETCH 2X75IN STRL (MISCELLANEOUS) ×1 IMPLANT
GAUZE XEROFORM 1X8 LF (GAUZE/BANDAGES/DRESSINGS) ×1 IMPLANT
GLOVE BIO SURGEON STRL SZ8 (GLOVE) ×1 IMPLANT
GLOVE BIOGEL PI IND STRL 7.0 (GLOVE) ×2 IMPLANT
GLOVE BIOGEL PI IND STRL 8 (GLOVE) ×1 IMPLANT
GOWN STRL REUS W/TWL LRG LVL3 (GOWN DISPOSABLE) ×1 IMPLANT
GOWN STRL REUS W/TWL XL LVL3 (GOWN DISPOSABLE) ×1 IMPLANT
KIT TURNOVER KIT A (KITS) ×1 IMPLANT
NDL HYPO 25X1 1.5 SAFETY (NEEDLE) ×1 IMPLANT
NEEDLE HYPO 25X1 1.5 SAFETY (NEEDLE) ×1 IMPLANT
NS IRRIG 1000ML POUR BTL (IV SOLUTION) ×1 IMPLANT
PACK MINOR (CUSTOM PROCEDURE TRAY) ×1 IMPLANT
PAD ARMBOARD POSITIONER FOAM (MISCELLANEOUS) ×1 IMPLANT
POSITIONER HEAD 8X9X4 ADT (SOFTGOODS) ×1 IMPLANT
SET BASIN LINEN APH (SET/KITS/TRAYS/PACK) ×1 IMPLANT
SPIKE FLUID TRANSFER (MISCELLANEOUS) ×1 IMPLANT
SUT VIC AB 4-0 SH 27XBRD (SUTURE) ×2 IMPLANT
SYR CONTROL 10ML LL (SYRINGE) ×1 IMPLANT

## 2024-05-19 NOTE — Anesthesia Procedure Notes (Signed)
 Procedure Name: LMA Insertion Date/Time: 05/19/2024 1:48 PM  Performed by: Pheobe Adine CROME, CRNAPre-anesthesia Checklist: Patient identified, Emergency Drugs available, Suction available, Patient being monitored and Timeout performed Patient Re-evaluated:Patient Re-evaluated prior to induction Oxygen Delivery Method: Circle system utilized Preoxygenation: Pre-oxygenation with 100% oxygen LMA: LMA inserted LMA Size: 5.0 Number of attempts: 1 Placement Confirmation: positive ETCO2, CO2 detector and breath sounds checked- equal and bilateral Tube secured with: Tape Dental Injury: Teeth and Oropharynx as per pre-operative assessment

## 2024-05-19 NOTE — Transfer of Care (Signed)
 Immediate Anesthesia Transfer of Care Note  Patient: Lawrence Kaiser  Procedure(s) Performed: CIRCUMCISION, ADULT (Penis)  Patient Location: PACU  Anesthesia Type:General  Level of Consciousness: drowsy  Airway & Oxygen Therapy: Patient Spontanous Breathing and Patient connected to nasal cannula oxygen  Post-op Assessment: Report given to RN and Post -op Vital signs reviewed and stable  Post vital signs: Reviewed and stable  Last Vitals:  Vitals Value Taken Time  BP 112/64   Temp 97.7   Pulse 71   Resp 12   SpO2 97%     Last Pain:  Vitals:   05/19/24 1244  PainSc: 0-No pain         Complications: No notable events documented.

## 2024-05-19 NOTE — Anesthesia Preprocedure Evaluation (Signed)
Anesthesia Evaluation  Patient identified by MRN, date of birth, ID band Patient awake    Reviewed: Allergy & Precautions, H&P , NPO status , Patient's Chart, lab work & pertinent test results, reviewed documented beta blocker date and time   Airway Mallampati: II  TM Distance: >3 FB Neck ROM: full    Dental no notable dental hx.    Pulmonary neg pulmonary ROS   Pulmonary exam normal breath sounds clear to auscultation       Cardiovascular Exercise Tolerance: Good hypertension, negative cardio ROS  Rhythm:regular Rate:Normal     Neuro/Psych negative neurological ROS  negative psych ROS   GI/Hepatic negative GI ROS, Neg liver ROS,,,  Endo/Other  negative endocrine ROS    Renal/GU negative Renal ROS  negative genitourinary   Musculoskeletal   Abdominal   Peds  Hematology negative hematology ROS (+)   Anesthesia Other Findings   Reproductive/Obstetrics negative OB ROS                             Anesthesia Physical Anesthesia Plan  ASA: 2  Anesthesia Plan: General and General LMA   Post-op Pain Management:    Induction:   PONV Risk Score and Plan: Ondansetron  Airway Management Planned:   Additional Equipment:   Intra-op Plan:   Post-operative Plan:   Informed Consent: I have reviewed the patients History and Physical, chart, labs and discussed the procedure including the risks, benefits and alternatives for the proposed anesthesia with the patient or authorized representative who has indicated his/her understanding and acceptance.     Dental Advisory Given  Plan Discussed with: CRNA  Anesthesia Plan Comments:        Anesthesia Quick Evaluation

## 2024-05-19 NOTE — H&P (Signed)
 HPI: [name] is a 72yo here for evaluation of balanitis. For the past 12-18 months he has been unable to retract his foreskin and he has had a rash for which he was using clotrimazole  with mixed results. He previously saw a urologist in [place] 3 years ago who recommended circumcision. No difficulty urinating.      PMH:     Past Medical History:  Diagnosis Date   Arthritis            Surgical History:      Past Surgical History:  Procedure Laterality Date   NECK SURGERY              Home Medications:  Allergies as of 02/17/2024   No Known Allergies         Medication List           Accurate as of February 17, 2024  9:59 AM. If you have any questions, ask your nurse or doctor.              clotrimazole -betamethasone  cream Commonly known as: LOTRISONE  Apply 1 Application topically in the morning and at bedtime.    meloxicam 15 MG tablet Commonly known as: MOBIC Take 15 mg by mouth daily.    multivitamin with minerals Tabs tablet Take 1 tablet by mouth daily.    neomycin-polymyxin b-dexamethasone 3.5-10000-0.1 Susp Commonly known as: MAXITROL Place 1 drop into both eyes 4 (four) times daily.    Potassium Gluconate 2.5 MEQ Tabs Take 1 tablet by mouth daily.    tobramycin 0.3 % ophthalmic solution Commonly known as: TOBREX Place 1 drop into both eyes 3 (three) times daily.             Allergies:  Allergies  No Known Allergies     Family History: No family history on file.       Social History:  reports that he has never smoked. He does not have any smokeless tobacco history on file. He reports that he does not drink alcohol and does not use drugs.   ROS: All other review of systems were reviewed and are negative except what is noted above in HPI   Physical Exam: BP (!) 150/85   Pulse (!) 56   Constitutional:  Alert and oriented, No acute distress. HEENT: [place] AT, moist mucus membranes.  Trachea midline, no masses. Cardiovascular: No clubbing,  cyanosis, or edema. Respiratory: Normal respiratory effort, no increased work of breathing. GI: Abdomen is soft, nontender, nondistended, no abdominal masses GU: No CVA tenderness. Uncircumcised phallus. Severe phimosis and balanitis present. No masses/lesions on penis, testis, scrotum.  Lymph: No cervical or inguinal lymphadenopathy. Skin: No rashes, bruises or suspicious lesions. Neurologic: Grossly intact, no focal deficits, moving all 4 extremities. Psychiatric: Normal mood and affect.   Laboratory Data: Recent Labs  No results found for: WBC, HGB, HCT, MCV, PLT     Recent Labs  No results found for: CREATININE     Recent Labs  No results found for: PSA     Recent Labs  No results found for: TESTOSTERONE     Recent Labs  No results found for: HGBA1C     Urinalysis Labs (Brief)  No results found for: COLORURINE, APPEARANCEUR, LABSPEC, PHURINE, GLUCOSEU, HGBUR, BILIRUBINUR, KETONESUR, PROTEINUR, UROBILINOGEN, NITRITE, LEUKOCYTESUR     Recent Labs  No results found for: LABMICR, WBCUA, RBCUA, LABEPIT, MUCUS, BACTERIA     Pertinent Imaging:   No results found for this or any previous visit.  No results found for this or any previous visit.   No results found for this or any previous visit.   No results found for this or any previous visit.   No results found for this or any previous visit.   No results found for this or any previous visit.   No results found for this or any previous visit.   No results found for this or any previous visit.     Assessment & Plan:     1. Encounter for circumcision (Primary) The risks/benefits/alternatives to circumcision was explained to the patient and he understands and wishes to proceed with surgery

## 2024-05-19 NOTE — Op Note (Signed)
 Preoperative diagnosis: phimosis  Postoperative diagnosis: Same  Procedure: circumcision  Attending: Belvie Clara, MD  Anesthesia: General  History of blood loss: Minimal  Antibiotics: ancef   Drains: none  Specimens: foreskin  Findings: dense phimotic ring. No masses/lesions on glans or foreskin.  Indications: Patient is a 73 year old male with a history of phimosis, difficulty urinating, and recurrent balanoprothitis  After discussing treatment options he decided to proceed with circumcision   Procedure in detail: Prior to procedure consent was obtained.  Patient was brought to the operating room and a brief timeout was done to ensure correct patient, correct procedure, correct site.  General anesthesia was administered and patient was placed in supine position.  His genitalia and abdomen was then prepped and draped in usual sterile fashion.  The phimotic ring was incised sharply and the foreskin was then retracted. An circumferential incision was made 1.5cm below the coronal ridge. We then pulled the foreskin over the glans and made a separate circumferential incision at the level of the coronal ridge. We then sharply incised the foreskin and then freed it from the dartos using electrocautery. We then sent the foreskin for pathology. Individual bleeders were then cauterized and good hemostasis was noted. We then reapproximated the penile skin to the subcoronal incision. We used interrupted 3-0 vicryl to close the incision. Once this was complete we applied a gauze bandage and this then concluded the procedure which was well tolerated by the patient.  Complications: None  Condition: Stable, extubated, transferred to PACU.  Plan: Patient is to be discharged home.  He is to followup in 2 weeks for a wound check

## 2024-05-20 ENCOUNTER — Encounter (HOSPITAL_COMMUNITY): Payer: Self-pay | Admitting: Urology

## 2024-05-20 NOTE — Anesthesia Postprocedure Evaluation (Signed)
 Anesthesia Post Note  Patient: Lawrence Kaiser  Procedure(s) Performed: CIRCUMCISION, ADULT (Penis)  Patient location during evaluation: Phase II Anesthesia Type: General Level of consciousness: awake Pain management: pain level controlled Vital Signs Assessment: post-procedure vital signs reviewed and stable Respiratory status: spontaneous breathing and respiratory function stable Cardiovascular status: blood pressure returned to baseline and stable Postop Assessment: no headache and no apparent nausea or vomiting Anesthetic complications: no Comments: Late entry   No notable events documented.   Last Vitals:  Vitals:   05/19/24 1500 05/19/24 1521  BP: (!) 152/89 (!) 149/84  Pulse: 60 (!) 54  Resp: 14 18  Temp:  36.7 C  SpO2: 98% 98%    Last Pain:  Vitals:   05/19/24 1521  TempSrc: Oral  PainSc: 4                  Yvonna JINNY Bosworth

## 2024-05-23 LAB — SURGICAL PATHOLOGY

## 2024-05-25 ENCOUNTER — Ambulatory Visit: Admitting: Urology

## 2024-06-08 ENCOUNTER — Encounter: Payer: Self-pay | Admitting: Urology

## 2024-06-08 ENCOUNTER — Ambulatory Visit (INDEPENDENT_AMBULATORY_CARE_PROVIDER_SITE_OTHER): Admitting: Urology

## 2024-06-08 VITALS — BP 144/69 | HR 62

## 2024-06-08 DIAGNOSIS — Z9889 Other specified postprocedural states: Secondary | ICD-10-CM

## 2024-06-08 MED ORDER — CLOTRIMAZOLE-BETAMETHASONE 1-0.05 % EX CREA: 1.0000 | TOPICAL_CREAM | Freq: Two times a day (BID) | CUTANEOUS | 3 refills | Status: AC

## 2024-06-08 NOTE — Progress Notes (Signed)
 06/08/2024 11:10 AM   Lawrence Kaiser 17-Apr-1951 [id number]  Referring provider: Harvey Lynwood DASEN, MD [address] [place],  [place]     HPI: [name] is a [phone] here for followup after circumcision. He is doing well after the surgery. NO drainage from the incision   PMH: Past Medical History:  Diagnosis Date   Arthritis     Surgical History: Past Surgical History:  Procedure Laterality Date   CIRCUMCISION N/A 05/19/2024   Procedure: CIRCUMCISION, ADULT;  Surgeon: Lawrence Lawrence CROME, MD;  Location: [facility];  Service: Urology;  Laterality: N/A;   NECK SURGERY     plate and screws in disc-June 2005   TOE SURGERY Left    crushed toe left- pins in both feet    Home Medications:  Allergies as of 06/08/2024   No Known Allergies      Medication List        Accurate as of June 08, 2024 11:10 AM. If you have any questions, ask your nurse or doctor.          bacitracin ointment Apply to affected area daily   Cholecalciferol 50 MCG (2000 UT) Tabs as directed orally once a day; Duration: 30 day(s)   HYDROcodone-acetaminophen  5-325 MG tablet Commonly known as: NORCO/VICODIN Take 1 tablet by mouth every 6 (six) hours as needed for moderate pain (pain score 4-6) or severe pain (pain score 7-10).   Pataday 0.1 % ophthalmic solution Generic drug: olopatadine Place 1 drop into both eyes 2 (two) times daily.        Allergies: No Known Allergies  Family History: No family history on file.  Social History:  reports that he has never smoked. He does not have any smokeless tobacco history on file. He reports that he does not drink alcohol and does not use drugs.  ROS: All other review of systems were reviewed and are negative except what is noted above in HPI  Physical Exam: BP (!) 144/69   Pulse 62   Constitutional:  Alert and oriented, No acute distress. HEENT: [place] AT, moist mucus membranes.  Trachea midline, no masses. Cardiovascular: No clubbing, cyanosis,  or edema. Respiratory: Normal respiratory effort, no increased work of breathing. GI: Abdomen is soft, nontender, nondistended, no abdominal masses GU: No CVA tenderness.  Lymph: No cervical or inguinal lymphadenopathy. Skin: No rashes, bruises or suspicious lesions. Neurologic: Grossly intact, no focal deficits, moving all 4 extremities. Psychiatric: Normal mood and affect.  Laboratory Data: Lab Results  Component Value Date   WBC 7.9 04/05/2024   HGB 15.6 04/05/2024   HCT 47.8 04/05/2024   MCV 90.9 04/05/2024   PLT 191 04/05/2024    Lab Results  Component Value Date   CREATININE 0.90 04/05/2024    No results found for: PSA  No results found for: TESTOSTERONE  No results found for: HGBA1C  Urinalysis No results found for: COLORURINE, APPEARANCEUR, LABSPEC, PHURINE, GLUCOSEU, HGBUR, BILIRUBINUR, KETONESUR, PROTEINUR, UROBILINOGEN, NITRITE, LEUKOCYTESUR  No results found for: LABMICR, WBCUA, RBCUA, LABEPIT, MUCUS, BACTERIA  Pertinent Imaging:  No results found for this or any previous visit.  No results found for this or any previous visit.  No results found for this or any previous visit.  No results found for this or any previous visit.  No results found for this or any previous visit.  No results found for this or any previous visit.  No results found for this or any previous visit.  No results found for this or  any previous visit.   Assessment & Plan:    1. Post-operative state (Primary) Clotrimazole  BID fro 21 days Followup prn   No follow-ups on file.  Lawrence Clara, MD  [facility] [place]

## 2024-06-08 NOTE — Patient Instructions (Signed)
 Circumcision, Adult, Care After After a circumcision, it is common for the area around your cut from surgery (incision) to have: Redness. Swelling. Soreness. Follow these instructions at home: Your doctor may give you more instructions. If you have problems, contact your doctor. Medicines Take or use over-the-counter and prescription medicines only as told by your doctor. If you were prescribed an antibiotic medicine, use it as told by your doctor. Do not stop using it even if you start to feel better. Bathing Do not get your incision area wet for 24 hours after the procedure, or as long as told by your doctor. Do not take baths, swim, or use a hot tub. Ask your doctor about taking showers or sponge baths. When you can shower, do not rub the incision area. Gently pat it dry. Incision care  Follow instructions from your doctor about how to take care of your incision. Make sure you: Wash your hands with soap and water  for at least 20 seconds before and after you change your bandage (dressing). If you cannot use soap and water , use hand sanitizer. Change your dressing. Leave the stitches alone. They will disappear over time. Check your incision area every day for signs of infection. Check for: More redness, swelling, or pain. More fluid or blood. Warmth. Pus or a bad smell. Managing pain and swelling If told, put ice on the painful area. To do this: Put ice in a plastic bag. Place a towel between your skin and the bag. Leave the ice on for 20 minutes, 2-3 times a day. Take off the ice if your skin turns bright red. This is very important. If you cannot feel pain, heat, or cold, you have a greater risk of damage to the area.  Activity  If you were given a sedative during your procedure, do not drive or use machines until your doctor says that it is safe. A sedative is a medicine that helps you relax. You may have to avoid lifting. Ask your doctor how much you can safely lift. Do not  have sex until your doctor says it is okay. Rest as told by your doctor. Return to your normal activities when your doctor says that it is safe. Avoid contact sports and biking until your doctor says it is okay. General instructions Do not smoke or use any products that contain nicotine  or tobacco. These can make it take longer for your incision to heal. If you need help quitting, ask your doctor. Drink enough fluid to keep your pee (urine) pale yellow. Keep all follow-up visits. Contact a doctor if: You have a fever. Medicine does not help your pain. You have any of these signs of infection around your incision: More redness, swelling, or pain. More fluid or blood. Warmth. Pus or a bad smell. Your incision breaks open. Get help right away if: You cannot pee, or it hurts to pee. There is redness, swelling, and soreness that spreads up the shaft of your penis, your thighs, or your lower belly (abdomen). You have bleeding that does not stop when you press on it. Summary After the procedure, it is common to have redness, swelling, and soreness around your cut from surgery (incision). Follow instructions from your doctor about how to take care of your incision. Check your incision area every day for signs of infection. Do not have sex until your doctor says it is okay. This information is not intended to replace advice given to you by your health care provider. Make sure  you discuss any questions you have with your health care provider. Document Revised: 07/15/2021 Document Reviewed: 07/15/2021 Elsevier Patient Education  2024 [employer].
